# Patient Record
Sex: Female | Born: 1955 | Race: White | Hispanic: No | Marital: Married
Health system: Midwestern US, Academic
[De-identification: ages and names within clinical notes are randomized; demographics above are authoritative.]

---

## 2018-11-15 ENCOUNTER — Encounter: Admit: 2018-11-15 | Discharge: 2018-11-15 | Payer: MEDICARE

## 2018-11-15 ENCOUNTER — Ambulatory Visit: Admit: 2018-11-15 | Discharge: 2018-11-15 | Payer: MEDICARE

## 2018-11-15 DIAGNOSIS — L409 Psoriasis, unspecified: Secondary | ICD-10-CM

## 2018-11-15 DIAGNOSIS — D229 Melanocytic nevi, unspecified: Secondary | ICD-10-CM

## 2018-11-15 MED ORDER — CALCIPOTRIENE 0.005 % TP OINT
TOPICAL | 6 refills | Status: DC
Start: 2018-11-15 — End: 2019-03-09

## 2018-11-15 MED ORDER — CLOBETASOL 0.05 % TP OINT
OPHTHALMIC | 1 refills | 30.00000 days | Status: DC
Start: 2018-11-15 — End: 2019-03-09

## 2018-11-15 NOTE — Progress Notes
ATTESTATION    I personally performed the key portions of the E/M visit, discussed case with resident and concur with resident documentation of history, physical exam, assessment, and treatment plan unless otherwise noted.    Staff name:  Tekela Garguilo M Onix Jumper, MD Date:  11/15/2018

## 2018-11-15 NOTE — Patient Instructions
-   avoid trauma, rubbing or scratching as this can exacerbate psoriasis  - follow up regularly with PCP to manage associated co-morbidities (Cardiovascular disease, metabolic syndrome, HTN, dyslipidemia, DM, hepatic disease, lymphoma, mood disorders)  - 30% of patients with psoriasis can develop psoriatic arthritis

## 2018-11-15 NOTE — Progress Notes
Date of Service: 11/15/2018    Subjective:             Meghan Nash is a 63 y.o. female.    History of Present Illness  New pt.    # Rash on L medial palm, R thumb, and R interdigital area between 1st and 2nd toes   - present since 2014  - past treatments:   - topical steroids (betamethasone ointment)   - many other creams   - has been told this is psoriasis  - has nephew with psoriasis  - no joint pain or swelling  - no h/o malignancy, TB, autoimmune disease, IBD, MS  - pt wants to try something other than topicals as she has tried these for years and does not get much benefit     # Brown spots on the head, trunk, arms and legs.   - H/o blistering sunburns. No tanning bed usage.    Personal Hx: No history of skin cancer.   Family Hx: Brother with melanoma.   Social Hx: quit smoking 2018       Review of Systems   Constitutional: Negative for appetite change, diaphoresis, fatigue, fever and unexpected weight change.   HENT: Negative for congestion, mouth sores and sore throat.    Eyes: Negative for pain, redness, itching and visual disturbance.   Respiratory: Negative for cough and shortness of breath.    Cardiovascular: Negative for palpitations and leg swelling.   Gastrointestinal: Negative for abdominal pain, blood in stool, diarrhea, nausea and vomiting.   Genitourinary: Negative for difficulty urinating and hematuria.   Musculoskeletal: Negative for arthralgias and myalgias.   Neurological: Negative for dizziness and seizures.   Hematological: Does not bruise/bleed easily.   Psychiatric/Behavioral: Negative for confusion and dysphoric mood. The patient is not nervous/anxious.          Objective:         No current outpatient medications on file.     Vitals:    11/15/18 1318   Resp: 16   Weight: 90.7 kg (200 lb)   Height: 170.2 cm (67)   PainSc: Zero     Body mass index is 31.32 kg/m?Marland Kitchen     Physical Exam        Areas Examined (all normal unless noted below):  General: Alert and Oriented x 3, Well-nourished Eyes: Normal Conjunctivae, EOMI  Psych: normal mood  Head/Face  Neck  Chest/breasts/axillae  Back  Abdomen  Buttocks/groin/genitalia   R upper ext  L upper ext  R lower ext  L lower ext    Pertinent findings include:  Multiple brown and tan evenly pigmented macules are distributed over the examined areas.  All have symmetric similar dermascopic findings with primarily globular and reticular patterns.    Red scaly plaque on L medial palm, R thumb    Red scaly plaque on R 1st and 2nd interdigital toe    Assessment and Plan:  # Melanocytic nevi  - Reassured examined lesions appear normal  - counseled lesions that change in size, shape, color or are tender warrant further evaluation  - RTC for new/changing lesions    # Psoriasis w/ out arthritis:  - failed topical steroids in the past  - avoid trauma, rubbing or scratching as this can exacerbate psoriasis  - follow up regularly with PCP to manage associated co-morbidities (Cardiovascular disease, metabolic syndrome, HTN, dyslipidemia, DM, hepatic disease, lymphoma, mood disorders)  - 30% of patients with psoriasis can develop psoriatic arthritis  -  switch to Rx clobetasol 0.05% ointment BID TAA on hands and feet, alternating every other day with below  - start Rx dovonex cream apply BID TAA on hands and feet every other day   - KOH scraping negative on feet   - plan to start Rx otezla (discussed benefits and side effects including GI upset, headache)      RTC 3 months

## 2018-11-17 ENCOUNTER — Encounter: Admit: 2018-11-17 | Discharge: 2018-11-17 | Payer: MEDICARE

## 2018-11-17 MED ORDER — APREMILAST 10 MG (4)-20 MG (4)-30 MG (47) PO DSPK
ORAL_TABLET | ORAL | 0 refills | 30.00000 days | Status: DC
Start: 2018-11-17 — End: 2019-03-09

## 2018-11-17 NOTE — Progress Notes
Prior-authorization for Calcipotriene 0.005% ointment 60g per 30 days was approved starting 11/17/2018 and is valid until 02/02/2020. Pharmacy has been notified of approval.   Celene Kras, LPN

## 2018-11-18 ENCOUNTER — Encounter: Admit: 2018-11-18 | Discharge: 2018-11-18 | Payer: MEDICARE

## 2018-11-18 MED ORDER — OTEZLA 30 MG PO TAB
30 mg | ORAL_TABLET | Freq: Two times a day (BID) | ORAL | 3 refills | 30.00000 days | Status: DC
Start: 2018-11-18 — End: 2019-03-09

## 2018-11-21 ENCOUNTER — Encounter: Admit: 2018-11-21 | Discharge: 2018-11-21 | Payer: MEDICARE

## 2018-11-21 NOTE — Progress Notes
The specialty pharmacy is currently waiting on the following information for [place] specialty medication, Rutherford Nail:    Patient's signature and Patient's financial information    The specialty pharmacy team has requested this information and until it is received the specialty pharmacy will not be able to continue the process for specialty medication access.    [place] Patient Advocate  [phone]

## 2018-11-21 NOTE — Progress Notes
The Prior Authorization for Rutherford Nail was approved for Mackynzie Lewerenz from 11/17/18 to 02/02/19.  The copay is $905.35.  Delylah Buice has stated this copay is not affordable.  Pharmacy will work on obtaining medication assistance.  Pharmacy will continue to follow.  If no assistance is available, the specialty pharmacy will contact the provider's clinic contact.    [place] Patient Advocate  [phone]

## 2018-12-05 ENCOUNTER — Encounter: Admit: 2018-12-05 | Discharge: 2018-12-05 | Payer: MEDICARE

## 2018-12-20 ENCOUNTER — Encounter: Admit: 2018-12-20 | Discharge: 2018-12-20 | Payer: MEDICARE

## 2019-03-09 ENCOUNTER — Encounter: Admit: 2019-03-09 | Discharge: 2019-03-09 | Payer: MEDICARE

## 2019-03-09 ENCOUNTER — Ambulatory Visit: Admit: 2019-03-09 | Discharge: 2019-03-09 | Payer: MEDICARE

## 2019-03-09 DIAGNOSIS — L239 Allergic contact dermatitis, unspecified cause: Secondary | ICD-10-CM

## 2019-03-09 DIAGNOSIS — K130 Diseases of lips: Secondary | ICD-10-CM

## 2019-03-09 DIAGNOSIS — L409 Psoriasis, unspecified: Secondary | ICD-10-CM

## 2019-03-09 MED ORDER — DESONIDE 0.05 % TP CREA
Freq: Two times a day (BID) | TOPICAL | 3 refills | Status: AC
Start: 2019-03-09 — End: ?

## 2019-03-09 MED ORDER — CLOBETASOL 0.05 % TP OINT
OPHTHALMIC | 1 refills | 30.00000 days | Status: DC
Start: 2019-03-09 — End: 2019-07-05

## 2019-03-09 MED ORDER — CALCIPOTRIENE 0.005 % TP OINT
TOPICAL | 6 refills | Status: AC
Start: 2019-03-09 — End: ?

## 2019-03-09 MED ORDER — APREMILAST 10 MG (4)-20 MG (4)-30 MG (47) PO DSPK
ORAL_TABLET | ORAL | 0 refills | 30.00000 days | Status: DC
Start: 2019-03-09 — End: 2019-05-23
  Filled 2019-05-03: qty 55, 28d supply, fill #1

## 2019-03-09 NOTE — Progress Notes
Date of Service: 03/09/2019    Subjective:             Meghan Nash is a 64 y.o. female.    History of Present Illness    Return patient, last seen 11/15/18    # Psoriasis w/o out arthritis   - Diagnosed at last visit, ordered [facility] but has not been approved yet  - Currently using clobetasol and dovonex  - present since 2014  - past treatments:               - topical steroids (betamethasone ointment)               - many other creams   - has nephew with psoriasis  - no joint pain or swelling  - no h/o malignancy, TB, autoimmune disease, IBD, MS  - pt wants to try something other than topicals as she has tried these for years and does not get much benefit   ?  # Brown spots on the head, trunk, arms and legs.?  - H/o blistering sunburns. No tanning bed usage.  ?  Personal Hx: No history of skin cancer.   Family Hx: Brother with melanoma.   Social Hx: quit smoking 2018     Review of Systems   Constitutional: Negative for appetite change and unexpected weight change.   Gastrointestinal: Negative for diarrhea, nausea and vomiting.         Objective:         ? apremilast (OTEZLA) 10 mg (4)-20 mg (4)-30 mg (47) tablet starter pack Take by mouth as directed on package.   ? apremilast (OTEZLA) 30 mg tablet Take one tablet by mouth twice daily.   ? calcipotriene (DOVONOX) 0.005 % topical ointment Apply to affected area on hands and feet every other day   ? cholecalciferol (VITAMIN D-3) 1,000 units tablet Take 1,000 Units by mouth daily.   ? clobetasoL (TEMOVATE) 0.05 % topical ointment Apply to affected areas on hands and feet every other day. Do not use on face, groin, underarms   ? escitalopram oxalate (LEXAPRO) 20 mg tablet Take 20 mg by mouth daily.   ? levothyroxine (SYNTHROID) 50 mcg tablet Take 50 mcg by mouth daily 30 minutes before breakfast.   ? losartan (COZAAR) 25 mg tablet Take 25 mg by mouth daily.   ? metFORMIN (GLUCOPHAGE) 1,000 mg tablet Take 1,000 mg by mouth twice daily with meals. ? omeprazole DR (PRILOSEC) 20 mg capsule Take 20 mg by mouth daily before breakfast.   ? pravastatin (PRAVACHOL) 40 mg tablet Take 40 mg by mouth at bedtime daily.   ? semaglutide (OZEMPIC) 0.25 mg or 0.5 mg(2 mg/1.5 mL) injection PEN Inject  under the skin every 7 days.   ? triamterene-hydrochlorothiazide (DYAZIDE) 37.5-25 mg capsule Take 1 capsule by mouth every morning.     Vitals:    03/09/19 1352   Weight: 90.7 kg (200 lb)   Height: 170.2 cm (67)   PainSc: Zero     Body mass index is 31.32 kg/m?Marland Kitchen     Physical Exam    Areas Examined (all normal unless noted below):  General: Alert and Oriented x 3, Well-nourished  Eyes: Normal Conjunctivae, EOMI  Psych: normal mood  Head/Face  Neck  Chest/breasts/axillae  Back  Abdomen  Buttocks/groin/genitalia   R upper ext  L upper ext  R lower ext  L lower ext  ?  Pertinent findings include:  Multiple brown and tan evenly pigmented macules are  distributed over the examined areas.? All have symmetric similar dermascopic findings with primarily globular and reticular patterns.  Pink scaly areas on the vermillion lips  Red scaly plaque on L medial palm, R thumb  Red scaly plaque on R 1st and 2nd interdigital toe     Assessment and Plan:    Cheilitis - new and flaring  - Favor irritant contact vs allergic contact dermatitis.   - Stop all products that you are currently using and switch to plain vaseline for moisturizer.   - Start desonide to the AA BID until improved, then use PRN.   - If no improvement, RTC for re-eval.     Psoriasis w/ out arthritis   - Currently mildly improved with topicals; plan was to do [place] but patient lost FA paperwork.   - Continue Rx clobetasol 0.05% ointment BID TAA on hands and feet, alternating every other day with below. Refilled today.  - Continue Rx dovonex cream apply BID TAA on hands and feet every other day. Refilled today.  - Does not live close, not a candidate for phototherapy - Plan to start Rx otezla (discussed benefits and side effects including GI upset, headache) as previously discussed if able to get it covered.  - Patient expressed understanding, all questions answered.   ?  Melanocytic nevi  - Reassured examined lesions appear normal  - counseled lesions that change in size, shape, color or are tender warrant further evaluation  - RTC for new/changing lesions    RTC 4 months    Charm Barges, MD  Department of Dermatology    In the presence of Gardner Candle, MD,  I have taken down these notes, Ferdinand Cava, Scribe. 03/09/2019 1:54 PM

## 2019-03-09 NOTE — Progress Notes
Submitted prior-authorization request and order for Otezla to [facility] [place] pharmacy, outcome pending.

## 2019-03-10 ENCOUNTER — Encounter: Admit: 2019-03-10 | Discharge: 2019-03-10 | Payer: MEDICARE

## 2019-03-10 NOTE — Progress Notes
Called Kristn Appelman to discuss medication assistance regarding their medication: Otezla.  Left voicemail asking patient to return call to the specialty pharmacy.  Will call patient again in 2 business days if no call back received.Thomes Lolling, [place] Patient Advocate, Specialty Pharmacy  [phone]

## 2019-03-14 ENCOUNTER — Encounter: Admit: 2019-03-14 | Discharge: 2019-03-14 | Payer: MEDICARE

## 2019-03-15 ENCOUNTER — Encounter: Admit: 2019-03-15 | Discharge: 2019-03-15 | Payer: MEDICARE

## 2019-04-17 ENCOUNTER — Encounter: Admit: 2019-04-17 | Discharge: 2019-04-17 | Payer: MEDICARE

## 2019-05-03 ENCOUNTER — Encounter: Admit: 2019-05-03 | Discharge: 2019-05-03 | Payer: MEDICARE

## 2019-05-03 NOTE — Progress Notes
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    The dosing regimen according to the dose titration schedule below is planned to continue indefinitely which is appropriate for [name]. No renal or hepatic adjustments are required.    The following dose titration will be used: 10mg  by mouth in the morning on day 1. Then 10mg  by mouth twice daily on day 2. Then 10 mg by mouth in the morning and 20mg  in the evening on day 3. Then 20mg  by mouth twice daily on day 4. Then 20 mg by mouth in the morning and 30mg  in the evening on day 5. Then 30mg  by mouth twice daily on day 6 and thereafter.     The patient has the ability to self-administer the medication.     Therapeutic Goals: Remission of psoriasis, skin healing, prevent worsening/spread of skin plaques and inflammation, reduce symptoms of itching, burning, and pain; avoid/mitigate side effects of Otezla    Baseline Characteristics    Previously trialed agents for this indication: betamethasone, clobetasol, calcipotriene   Medications currently being used for this indication: betamethasone, clobetasol,   calcipotriene   Allergy and/or intolerance to previous medications: penicillins   Criteria for therapy/risk factors:Currently mildly improved with topicals   Pattern/location of skin changes: Multiple brown and tan evenly pigmented macules  are distributed over the examined areas.?All have symmetric similar dermascopic  findings with primarily globular and reticular patterns.Pink scaly areas on the  vermillion lips. Red scaly plaque on L medial palm, R thumb. Red scaly plaque on R  1st and  2nd interdigital toe   Involved joints: none   Mental health history: The patient does not have a history of mental health problems.     Labs  Wt Readings from Last 1 Encounters:   03/09/19 90.7 kg (200 lb)     CrCl cannot be calculated (No successful lab value found.).     Allergies  Allergies   Allergen Reactions   ? Pcn [Penicillins] RASH       Past Medical History  No past medical history on file.    Vaccination Status Assessment     There is no immunization history on file for this patient.  Vaccine history was reviewed with the patient. The patient was reminded about the importance of receiving an annual influenza vaccine as indicated.     Pregnancy Status  Pregnancy status was assessed and determined to be: Female, not of child-bearing potential: education not applicable.     Medication Reconciliation  Medication reconciliation is based on the patient?s most recent med list in electronic medical record (EMR) including herbal products and OTC medications. The patients? medication list will be updated during patient education, after speaking with the patient and prior to dispensing the medication.     Home Medications    Medication Sig   apremilast (OTEZLA) 10 mg (4)-20 mg (4)-30 mg (47) tablet starter pack Take by mouth as directed on package.   calcipotriene (DOVONOX) 0.005 % topical ointment Apply to affected area on hands and feet every other day   cholecalciferol (VITAMIN D-3) 1,000 units tablet Take 1,000 Units by mouth daily.   clobetasoL (TEMOVATE) 0.05 % topical ointment Apply to affected areas on hands and feet every other day. Do not  use on face, groin, underarms   desonide (TRIDESILON) 0.05 % topical cream Apply  topically to affected area twice daily.   escitalopram oxalate (LEXAPRO) 20 mg tablet Take 20 mg by mouth daily.   levothyroxine (SYNTHROID) 50 mcg tablet Take 50 mcg by mouth daily 30 minutes before breakfast.   losartan (COZAAR) 25 mg tablet Take 25 mg by mouth daily.   metFORMIN (GLUCOPHAGE) 1,000 mg tablet Take 1,000 mg by mouth twice daily with meals.   omeprazole DR (PRILOSEC) 20 mg capsule Take 20 mg by mouth daily before breakfast.   pravastatin (PRAVACHOL) 40 mg tablet Take 40 mg by mouth at bedtime daily.   semaglutide (OZEMPIC) 0.25 mg or 0.5 mg(2 mg/1.5 mL) injection PEN Inject  under the skin every 7 days.   triamterene-hydrochlorothiazide (DYAZIDE) 37.5-25 mg capsule Take 1 capsule by mouth every morning.       Drug-Drug Interactions  No new significant drug-drug or drug-food interactions were identified.    Drug-Food Interactions  There are no significant drug-food interactions. This medication can be taken with or without food.     Contraindications  Contraindications to the use of Otezla (apremilast)  include a serious hypersensitivity to Otezla (apremilast) or any component of the formulation.    Safety Precautions  Neuropsychiatric effects: Neuropsychiatric effects (eg, depression, suicidal ideation, mood changes) have been reported. Use with caution in patients with a history of depression and/or suicidal thoughts/behavior. Instruct patients/caregivers to report worsening psychiatric symptoms and consider risks/benefits of continuation of therapy in such patients.     Weight loss: May cause weight loss; monitor weight regularly. Discontinuation of therapy should be considered with unexplained or significant weight loss.     Risk Evaluation and Mitigation Strategy (REMS) Assessment  No REMS is required for this medication.     Meghan Nash was contacted via telephone to provide medication education on their new specialty medication.    Meghan Nash was educated on the medication name Meghan Nash (apremilast) along with the medication class PDE-4. The indication for treatment, dose, route, frequency, and duration were discussed with the patient. The patient was educated on timely administration of therapy and management of missed doses. Adherence with therapy was discussed with the patient. The patient's ability to be adherent with drug therapies was discussed and the patient was provided options for tools/resources that promote adherence to therapy.     The patient's ability to self-administer the medication was assessed.  The patient was educated on proper administration/injection technique for their therapy and verbalized acceptance and understanding. Appropriate safe-handling, storage, and disposal directions were reviewed with the patient.     Contraindications to therapy, safety precautions, and common side effects were discussed with the patient. They were instructed to seek medical attention immediately if they experience signs of an allergic reaction, including but not limited to: a rash; hives; itching; red, swollen, blistered, or peeling skin with or without fever.     Requirements of the REMS program were discussed with the patient as applicable.     A medication history and reconciliation was performed (including prescription medications, supplements, over the counter, and herbal products).    Drug-drug and drug-food interactions with the new therapy were assessed and reviewed with the patient. The patient was instructed to speak with their health care provider before starting any new drug, including prescription or over the counter, natural products, or vitamins.    Pregnancy potential was reviewed with the patient. Female, not of child-bearing potential: education not applicable.  Appropriate recommended vaccinations were reviewed and discussed with the patient.    The monitoring and follow-up plan was discussed with the patient. The patient was instructed to contact their health care provider if their symptoms or health problems do not get better or if they become worse. Meghan Nash was instructed to contact the specialty pharmacy at [phone] if they have any questions or concerns regarding their medication therapy.     Based on the patients' preference the medication(s) will be picked up or shipped from [facility].     Meghan Nash was given the opportunity to ask questions and did not have any questions at this time.     Follow-up Plan   An initial medication assessment and education has been completed.     Meghan Nash, PHARMD

## 2019-05-08 ENCOUNTER — Encounter: Admit: 2019-05-08 | Discharge: 2019-05-08 | Payer: MEDICARE

## 2019-05-08 NOTE — Progress Notes
Copay assistance was obtained for the specialty medication Rutherford Nail using grant from [employer] and now the copay is $0.  Meghan Nash has stated this copay is affordable.  The specialty pharmacy will pursue additional copay assistance as necessary.  The specialty pharmacy will reach out to the ambulatory clinical pharmacist if the copay becomes unaffordable.    The medication will be delivered to patient's prescription address per the patient's request.    [place] Patient Advocate  [phone]

## 2019-05-23 MED ORDER — OTEZLA 30 MG PO TAB
30 mg | ORAL_TABLET | Freq: Two times a day (BID) | ORAL | 3 refills | 30.00000 days | Status: AC
Start: 2019-05-23 — End: ?
  Filled 2019-05-25: qty 60, 30d supply, fill #1

## 2019-05-23 MED ORDER — OTEZLA STARTER 10 MG (4)-20 MG (4)-30 MG (47) PO DSPK
ORAL_TABLET | 0 refills | Status: CN
Start: 2019-05-23 — End: ?

## 2019-06-06 ENCOUNTER — Encounter: Admit: 2019-06-06 | Discharge: 2019-06-06 | Payer: MEDICARE

## 2019-06-06 NOTE — Progress Notes
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    The patient has completed their initial dose titration and will continue their regimen of 30 mg by mouth twice daily is planned to continue indefinitely which is appropriate for Meghan Nash. No renal or hepatic adjustments are required.     Response to Therapy  Patient assessments:  Subjective clinical assessment: on a scale of 1 to 10, the patient rates they are feeling 8 out of 10 while on treatment.  Subjective Quality of Life Measurement: 10 was able to complete all normal daily activities all days over the past 30 days.    Therapeutic Goals:  Concurrent medications being used for this indication: betamethasone, clobetasol, calcipotriene  Steroid use in the past 30 days: None  Body surface area involvement: Area around her mouth has cleared up 80% and her hands have improved 40%    As the patient is achieving therapeutic benefit from the therapy the plan is to continue.     Adverse Effects  Meghan Nash is having the following adverse effect(s) of diarrhea but cleared up after completing the starter pack. The adverse effect(s) were not severe enough to interfere with adherence    Adherence  Refill and adherence history were reviewed with the patient. The patient is adherent with refills and is meeting their refill goal. They report no missed doses over the past 30 days.  The patient is meeting their adherence goal. The patient was reminded about the refill process and re-educated on the importance of adherence.    Allergies   Allergies   Allergen Reactions   ? Pcn [Penicillins] RASH        Vaccination Status Assessment     There is no immunization history on file for this patient.  Vaccine history was reviewed with the patient. The patient was reminded about the importance of receiving an annual influenza vaccine as indicated.     Medication Reconciliation  A medication history and reconciliation were performed (including prescription medications, supplements, over the counter, and herbal products). The medication list was updated and the patients? current medication list is included below.     Home Medications    Medication Sig   apremilast (OTEZLA) 30 mg tablet Take one tablet by mouth twice daily.   calcipotriene (DOVONOX) 0.005 % topical ointment Apply to affected area on hands and feet every other day   cholecalciferol (VITAMIN D-3) 1,000 units tablet Take 1,000 Units by mouth daily.   clobetasoL (TEMOVATE) 0.05 % topical ointment Apply to affected areas on hands and feet every other day. Do not use on face, groin, underarms   desonide (TRIDESILON) 0.05 % topical cream Apply  topically to affected area twice daily.   escitalopram oxalate (LEXAPRO) 20 mg tablet Take 20 mg by mouth daily.   levothyroxine (SYNTHROID) 50 mcg tablet Take 50 mcg by mouth daily 30 minutes before breakfast.   losartan (COZAAR) 25 mg tablet Take 25 mg by mouth daily.   metFORMIN (GLUCOPHAGE) 1,000 mg tablet Take 1,000 mg by mouth twice daily with meals.   omeprazole DR (PRILOSEC) 20 mg capsule Take 20 mg by mouth daily before breakfast.   pravastatin (PRAVACHOL) 40 mg tablet Take 40 mg by mouth at bedtime daily.   semaglutide (OZEMPIC) 0.25 mg or 0.5 mg(2 mg/1.5  mL) injection PEN Inject  under the skin every 7 days.   triamterene-hydrochlorothiazide (DYAZIDE) 37.5-25 mg capsule Take 1 capsule by mouth every morning.        Drug-drug and drug-food interactions between the patients? specialty medication and their medication list were assessed and reviewed with the patient. The patient was instructed to speak with their health care provider before starting any new drugs, including prescription or over the counter, natural / herbal products, or vitamins.    No new significant drug-drug or drug-food interactions were identified.    Their regimen can be taken with or without food.    Pregnancy Status  Pregnancy status was assessed and determined to be: Female, not of child-bearing potential: education not applicable.     Risk Evaluation and Mitigation Strategy (REMS) Assessment  No REMS is required for this medication.     Follow-up Plan   Meghan Nash was given the opportunity to ask questions and did not have any questions at this time. The patient was encouraged to call with questions. The patient will be contacted to complete another reassessment within 1 year.    Meghan Nash, PHARMD

## 2019-07-05 ENCOUNTER — Encounter: Admit: 2019-07-05 | Discharge: 2019-07-05 | Payer: MEDICARE

## 2019-07-05 ENCOUNTER — Ambulatory Visit: Admit: 2019-07-05 | Discharge: 2019-07-06 | Payer: MEDICARE

## 2019-07-05 DIAGNOSIS — L821 Other seborrheic keratosis: Secondary | ICD-10-CM

## 2019-07-05 DIAGNOSIS — D229 Melanocytic nevi, unspecified: Secondary | ICD-10-CM

## 2019-07-05 MED ORDER — CLOBETASOL 0.05 % TP OINT
OPHTHALMIC | 1 refills | 30.00000 days | Status: AC
Start: 2019-07-05 — End: ?

## 2019-07-05 MED FILL — OTEZLA 30 MG PO TAB: 30 mg | ORAL | 30 days supply | Qty: 60 | Fill #2 | Status: AC

## 2019-07-05 NOTE — Progress Notes
Date of Service: 07/05/2019    Subjective:             Meghan Nash is a 64 y.o. female.    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  # Brown spots on the head, trunk, arms and legs.?  - H/o blistering sunburns. No tanning bed usage.  ?    Personal Hx: No history of skin cancer.   Family Hx: Brother with melanoma.   Social Hx: quit smoking 2018        Review of Systems   Constitutional: Negative for appetite change and unexpected weight change.   Gastrointestinal: Negative for diarrhea, nausea and vomiting.         Objective:         ? apremilast (OTEZLA) 30 mg tablet Take one tablet by mouth twice daily.   ? calcipotriene (DOVONOX) 0.005 % topical ointment Apply to affected area on hands and feet every other day   ? cholecalciferol (VITAMIN D-3) 1,000 units tablet Take 1,000 Units by mouth daily.   ? clobetasoL (TEMOVATE) 0.05 % topical ointment Apply to affected areas on hands and feet every other day. Do not use on face, groin, underarms   ? desonide (TRIDESILON) 0.05 % topical cream Apply  topically to affected area twice daily.   ? escitalopram oxalate (LEXAPRO) 20 mg tablet Take 20 mg by mouth daily.   ? levothyroxine (SYNTHROID) 50 mcg tablet Take 50 mcg by mouth daily 30 minutes before breakfast.   ? losartan (COZAAR) 25 mg tablet Take 25 mg by mouth daily.   ? metFORMIN (GLUCOPHAGE) 1,000 mg tablet Take 1,000 mg by mouth twice daily with meals.   ? omeprazole DR (PRILOSEC) 20 mg capsule Take 20 mg by mouth daily before breakfast.   ? pravastatin (PRAVACHOL) 40 mg tablet Take 40 mg by mouth at bedtime daily.   ? semaglutide (OZEMPIC) 0.25 mg or 0.5 mg(2 mg/1.5 mL) injection PEN Inject  under the skin every 7 days.   ? triamterene-hydrochlorothiazide (DYAZIDE) 37.5-25 mg capsule Take 1 capsule by mouth every morning.     Vitals:    07/05/19 1511   Weight: 90.7 kg (200 lb)   Height: 170.2 cm (67)   PainSc: Zero     Body mass index is 31.32 kg/m?Marland Kitchen     Physical Exam  Areas Examined (all normal unless noted below):  General: Alert and Oriented x 3, Well-nourished  Eyes: Normal Conjunctivae, EOMI  Psych: normal mood  Head/Face  Neck  R upper ext  L upper ext  R lower ext  L lower ext  ?  Pertinent findings include:  Multiple brown and tan evenly pigmented macules are distributed over the examined areas.? All have symmetric similar dermascopic findings with primarily globular and reticular patterns.  Soft, pigmented, stuck-on-appearing papules are distributed over the bilateral  arms.  All have symmetric pebbled dermoscopic findings.  Red scaly plaque on L medial palm, R thumb  Red scaly plaque on R 1st and 2nd interdigital toe  *both above areas are thinner compared to prior visits     Assessment and Plan:    # Psoriasis w/ out arthritis   - Does not live close, not a candidate for phototherapy  - Continue Rx clobetasol 0.05% ointment BID TAA on hands and feet, alternating every other day with below. Refilled today.  - Continue Rx dovonex cream apply BID TAA on hands and feet every other day.   - Continue Rx Otezla 30mg  BID  - discussed the risks and benefits of Apremilast with patient including most commonly diarrhea, N/V and occasionally URI, depression, weight loss.   - Stressed importance of closely monitoring for any mood changes, worsening of depression  - patient verbalized understanding. All Qs answered  ?  # Melanocytic nevi  - Reassured examined lesions appear normal  - counseled lesions that change in size, shape, color or are tender warrant further evaluation  - RTC for new/changing lesions    # Seborrheic Keratoses  - reassurance, explained benign nature      RTC 3-4 months; will plan to do FBSE at next visit

## 2019-07-05 NOTE — Progress Notes
ATTESTATION    I personally performed the key portions of the E/M visit, discussed case with resident and concur with resident documentation of history, physical exam, assessment, and treatment plan unless otherwise noted.    Staff name:  Illene Labrador, MD Date:  07/05/2019

## 2019-07-06 DIAGNOSIS — L409 Psoriasis, unspecified: Principal | ICD-10-CM

## 2019-08-15 ENCOUNTER — Encounter: Admit: 2019-08-15 | Discharge: 2019-08-15 | Payer: MEDICARE

## 2019-08-16 MED FILL — OTEZLA 30 MG PO TAB: 30 mg | ORAL | 30 days supply | Qty: 60 | Fill #3 | Status: AC

## 2019-09-13 ENCOUNTER — Encounter: Admit: 2019-09-13 | Discharge: 2019-09-13 | Payer: MEDICARE

## 2019-09-18 ENCOUNTER — Encounter: Admit: 2019-09-18 | Discharge: 2019-09-18 | Payer: MEDICARE

## 2019-09-18 NOTE — Progress Notes
This is the third and final attempt to reach Meghan Nash to refill her specialty medication, Henderson [place], which was due for a refill on 09/15/2019.   Left voicemail asking the patient to call the specialty pharmacy at [phone].    At this time, the patient will be removed from the specialty pharmacy refill management program. The patient may be re-enrolled at any time by contacting the specialty pharmacy.    Fredda Hammed, CPhT  Pharmacy Patient Advocate, Specialty Pharmacy

## 2019-11-01 ENCOUNTER — Encounter: Admit: 2019-11-01 | Discharge: 2019-11-01 | Payer: MEDICARE

## 2019-11-01 ENCOUNTER — Ambulatory Visit: Admit: 2019-11-01 | Discharge: 2019-11-02 | Payer: MEDICARE

## 2019-11-01 DIAGNOSIS — D229 Melanocytic nevi, unspecified: Secondary | ICD-10-CM

## 2019-11-01 DIAGNOSIS — L821 Other seborrheic keratosis: Secondary | ICD-10-CM

## 2019-11-01 DIAGNOSIS — K130 Diseases of lips: Secondary | ICD-10-CM

## 2019-11-01 DIAGNOSIS — Z79899 Other long term (current) drug therapy: Secondary | ICD-10-CM

## 2019-11-01 DIAGNOSIS — L409 Psoriasis, unspecified: Principal | ICD-10-CM

## 2019-11-01 MED ORDER — CALCIPOTRIENE 0.005 % TP OINT
TOPICAL | 6 refills | Status: AC
Start: 2019-11-01 — End: ?

## 2019-11-01 MED ORDER — DESONIDE 0.05 % TP CREA
Freq: Two times a day (BID) | TOPICAL | 3 refills | Status: AC
Start: 2019-11-01 — End: ?

## 2019-11-01 MED ORDER — CLOBETASOL 0.05 % TP OINT
OPHTHALMIC | 1 refills | 30.00000 days | Status: AC
Start: 2019-11-01 — End: ?

## 2019-11-01 NOTE — Progress Notes
Date of Service: 11/01/2019    Subjective:             Meghan Nash is a 64 y.o. female.    History of Present Illness  Return patient, last seen 07/2019  ?  #?Psoriasis w/o out arthritis   ?  Prior hx  - present since 2014  - has nephew with psoriasis  - no joint pain or swelling  -?no h/o malignancy, TB, autoimmune disease, IBD, MS  ?  - Current treatments               - clobetasol and dovonex               - Otezla 30mg  BID (start after last visit)  Interval  - patient denies any worsening of depression since starting Otezla  - noted GI symptoms with Henderson [place], but this has improved  - Pt has not been using her topical steroids due to losing them while moving houses.   - She does not think Henderson [place] is working well.  ?  # Brown spots on the head, trunk, arms and legs.?  - H/o blistering sunburns.?No tanning bed usage.  ?  ?  Personal Hx:?No?history of skin cancer.   Family Hx:?Brother with melanoma.?  Social Hx:?quit smoking 2018     Review of Systems   Constitutional: Negative for appetite change and unexpected weight change.   Gastrointestinal: Negative for diarrhea, nausea and vomiting.         Objective:         ? apremilast (OTEZLA) 30 mg tablet Take one tablet by mouth twice daily.   ? calcipotriene (DOVONOX) 0.005 % topical ointment Apply to affected area on hands and feet every other day   ? cholecalciferol (VITAMIN D-3) 1,000 units tablet Take 1,000 Units by mouth daily.   ? clobetasoL (TEMOVATE) 0.05 % topical ointment Apply to affected areas on hands and feet every other day. Do not use on face, groin, underarms   ? desonide (TRIDESILON) 0.05 % topical cream Apply  topically to affected area twice daily.   ? escitalopram oxalate (LEXAPRO) 20 mg tablet Take 20 mg by mouth daily.   ? levothyroxine (SYNTHROID) 50 mcg tablet Take 50 mcg by mouth daily 30 minutes before breakfast.   ? losartan (COZAAR) 25 mg tablet Take 25 mg by mouth daily.   ? metFORMIN (GLUCOPHAGE) 1,000 mg tablet Take 1,000 mg by mouth twice daily with meals.   ? omeprazole DR (PRILOSEC) 20 mg capsule Take 20 mg by mouth daily before breakfast.   ? pravastatin (PRAVACHOL) 40 mg tablet Take 40 mg by mouth at bedtime daily.   ? semaglutide (OZEMPIC) 0.25 mg or 0.5 mg(2 mg/1.5 mL) injection PEN Inject  under the skin every 7 days.   ? triamterene-hydrochlorothiazide (DYAZIDE) 37.5-25 mg capsule Take 1 capsule by mouth every morning.     Vitals:    11/01/19 1512   Weight: 90.7 kg (200 lb)   Height: 170.2 cm (67)   PainSc: Zero     Body mass index is 31.32 kg/m?Marland Kitchen     Physical Exam  Areas Examined (all normal unless noted below):  General: Alert and Oriented x 3, Well-nourished  Eyes: Normal Conjunctivae, EOMI  Psych: normal mood  Head/Face  Neck  R upper ext  L upper ext  R lower ext  L lower ext  ?  Pertinent findings include:  Multiple brown and tan evenly pigmented macules are distributed over the examined areas.??All have  symmetric similar dermascopic findings with primarily globular and reticular patterns.  Soft, pigmented, stuck-on-appearing papules are distributed over the bilateral arms.  All have symmetric pebbled dermoscopic findings.  Red scaly plaque on L medial palm, R thumb  Red scaly plaque on R 1st and 2nd interdigital toe     Assessment and Plan:  ?  # Psoriasis w/?out?arthritis   - Does not live close, not a candidate for phototherapy  -?Continue?Rx clobetasol?0.05% ointment BID TAA on hands and feet, alternating every other day with below. Refilled today.  - Continue Rx dovonex cream apply BID TAA on hands and feet every other day.    - - Discussed the benefits, risks and side effects of IL-17 inhibitors in great details  - There is no personal history of inflammatory bowel disease  - Reviewed common adverse effects may include nasopharyngitis, URI, headache, herpes infection, injection site reaction, skin rash, nausea, and antibody development. Discussed this medicine may increase risk of serious infections including candida and tuberculosis, irritable bowel disease, and may cause anaphylaxis/hypersensitivity reaction. Instructed to hold the medication and seek medical attention if these occur   - Pt verbalized understanding and opted to start Rx ixekizumab (Taltz) 160 mg injection once followed by 80 mg at weeks 2, 4 ,6, 8, 10 and 12, and then 80 mg every 4 weeks  - Advised to avoid live vaccinations while on this medication  - Periodic lab monitoring will be required. Check CBC, CMP, QuantiFERON gold, HBVsAg, HBVsAb, HBVcAb, HCV antibody.  - Will need QuantiFERON gold yearly  - Advised patient to get her COVID-19 booster prior to starting Taltz   - Pharmacist messaged to begin approval process     # Melanocytic nevi  - Reassured examined lesions appear normal  - counseled lesions that change in size, shape, color or are tender warrant further evaluation  - RTC for new/changing lesions  ?  # Seborrheic Keratoses  - reassurance, explained benign nature  ?  RTC 3-4 months; will plan to do FBSE at next visit

## 2019-11-01 NOTE — Progress Notes
Provided education on ixekizumab Altamease Oiler), including:  -purpose, expected benefit  -dosing   -SC: 160mg  once; then 80mg  at weeks 2, 4, 6, 8, 10, 12; then 80 mg monthly thereafter  -side effects   -most common: injection-site reaction, N/D   -rare: IBD  -infection prevention   -staying up to date with inactivated vaccinations, avoid live vaccinations   -contact clinic during significant illness (fever or antibiotic use)   -inform clinic if any procedures are scheduled  -monitoring    -TB screening      Demonstrated Taltz devices for patient in clinic. She prefers prefilled syringe instead of autoinjector pen. Per Dr. Kathie Dike, will wait to send order until labs are resulted. Since patient cannot have TB test drawn today (after 2pm), she prefers all labs to be sent to the hospital in [place], [place]. Will ask Marcelino Duster, LPN to send orders.     Sharlee Blew, PHARMD

## 2019-11-02 ENCOUNTER — Encounter: Admit: 2019-11-02 | Discharge: 2019-11-02 | Payer: MEDICARE

## 2019-11-20 ENCOUNTER — Encounter: Admit: 2019-11-20 | Discharge: 2019-11-20 | Payer: MEDICARE

## 2019-11-20 NOTE — Progress Notes
Baseline labs needed prior to starting specialty medication, ixekizumab Altamease Oiler).     Labs needed: TB, Hep B antigen, Hep B core, Hep B antibody, CBC w/ Diff, Creatinine and hepatitis C antibody  Communication with Physician/APP: Physician/APP aware  Plan for obtaining labs: Patient plans to have labs drawn outside at [facility]       Called patient to check to see if she has completed baseline labs yet. Orders were sent to [facility] on 11/02/19. Patient was unaware orders were sent but called and confirmed they received them. Patient stated she will be getting labs this week. Instructed patient to have hospital fax results to [facility] dermatology clinic.     Sharlee Blew, PHARMD

## 2019-12-19 ENCOUNTER — Encounter: Admit: 2019-12-19 | Discharge: 2019-12-19 | Payer: MEDICARE

## 2019-12-25 ENCOUNTER — Encounter: Admit: 2019-12-25 | Discharge: 2019-12-25 | Payer: MEDICARE

## 2019-12-25 NOTE — Progress Notes
Contacted Meghan Nash to discuss renewing foundation assistance regarding their medication: R[place]ford Nail.  Left voicemail asking patient to return call to the specialty pharmacy [phone]).  Will call patient again in 5 business days if no call back received.Magdalen Spatz, CPhT  Pharmacy Patient Advocate, Specialty Pharmacy  [phone]

## 2020-01-03 ENCOUNTER — Encounter: Admit: 2020-01-03 | Discharge: 2020-01-03 | Payer: MEDICARE

## 2020-01-03 NOTE — Progress Notes
This is the third and final attempt to reach Meghan Nash to renew her foundation assistance her specialty medication, Rutherford Nail, which will expire on 02/02/2020.   Left voicemail asking the patient to call the specialty pharmacy at [phone].    Magdalen Spatz, CPhT  Pharmacy Patient Advocate, Specialty Pharmacy

## 2020-01-29 ENCOUNTER — Encounter: Admit: 2020-01-29 | Discharge: 2020-01-29 | Payer: MEDICARE

## 2020-02-27 ENCOUNTER — Encounter: Admit: 2020-02-27 | Discharge: 2020-02-27 | Payer: MEDICARE

## 2020-05-15 ENCOUNTER — Encounter: Admit: 2020-05-15 | Discharge: 2020-05-15 | Payer: MEDICARE

## 2020-05-15 NOTE — Progress Notes
Based on fill history and previous visit notes, patient is no longer taking the [place]. Thus, the patient will be removed from the specialty pharmacy patient management program. The medication has been removed from the patient's active medication list.  Should the patient's care team wish to restart the medication, a new prescription will need to be sent to [facility].       Also called patient to ask if she would like to proceed with Cosentyx as discussed in September (but never initiated). No answer. Left voicemail asking patient to return call to dermatology pharmacist at [phone]. If no return call from patient, will attempt again at a later date.     Henrietta Hoover, PHARMD

## 2021-03-19 ENCOUNTER — Encounter: Admit: 2021-03-19 | Discharge: 2021-03-19 | Payer: MEDICARE

## 2021-12-10 ENCOUNTER — Encounter: Admit: 2021-12-10 | Discharge: 2021-12-10 | Payer: MEDICARE

## 2021-12-15 ENCOUNTER — Encounter: Admit: 2021-12-15 | Discharge: 2021-12-15 | Payer: MEDICARE

## 2022-03-17 ENCOUNTER — Encounter: Admit: 2022-03-17 | Discharge: 2022-03-17 | Payer: MEDICARE

## 2023-09-25 IMAGING — MR MR knee LT wo con
5 of 7 series · 33 of 40 positions shown · non-contrast
Comparison: None.

Procedure(s): MR knee LT wo con

EXAMINATION: LEFT KNEE MAGNETIC RESONANCE IMAGING WITHOUT
CONTRAST.
HISTORY: Left knee pain.
TECHNIQUE: Multiplanar, multi-sequential MR images of the
left knee were
obtained without intravenous or intra-articular contrast.

[Series 5: PD fat-sat · axial · 4.0mm · 0.50mm/px · z∈[-83,+37]mm · 8 of 25 slices shown]
[im 1/25]
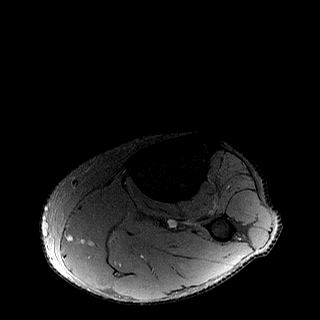
[im 4/25]
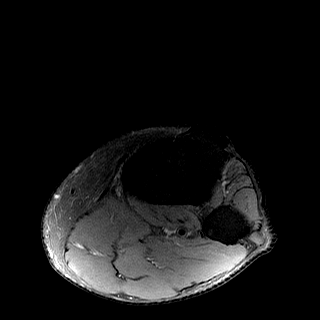
[im 7/25]
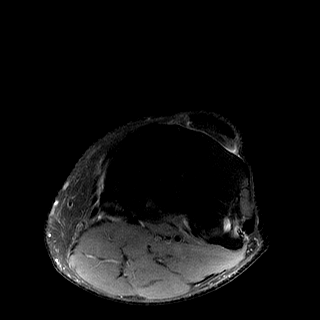
[im 11/25]
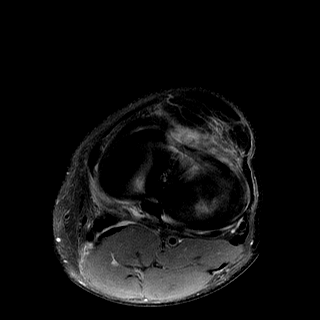
[im 14/25]
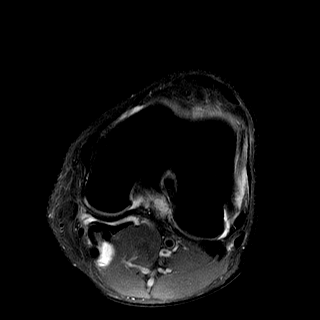
[im 18/25]
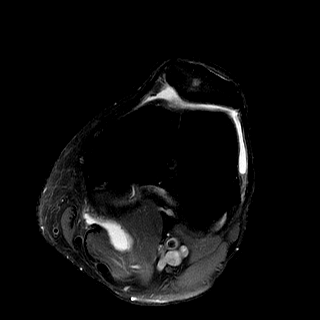
[im 21/25]
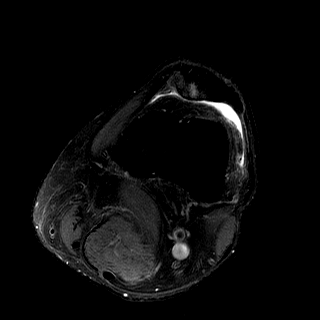
[im 25/25]
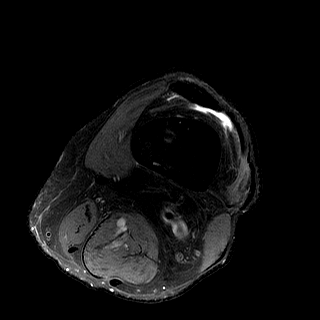

[Series 6: T1 · coronal · 4.0mm · 0.50mm/px · 8 of 25 slices shown]
[im 1/25]
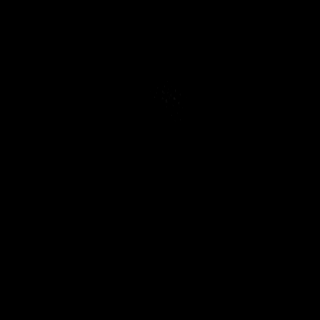
[im 4/25]
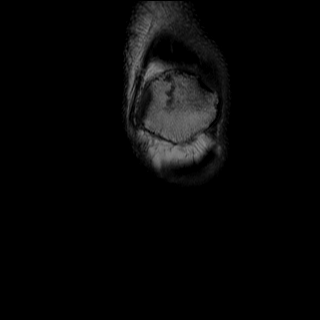
[im 7/25]
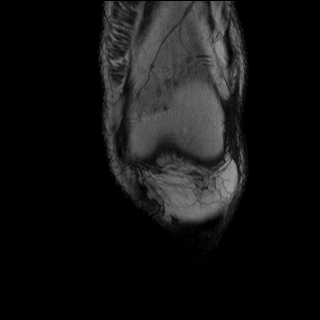
[im 11/25]
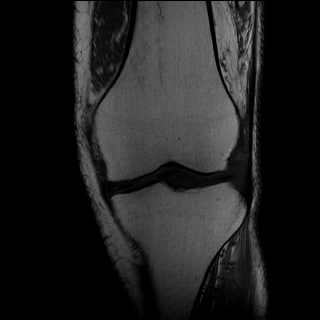
[im 14/25]
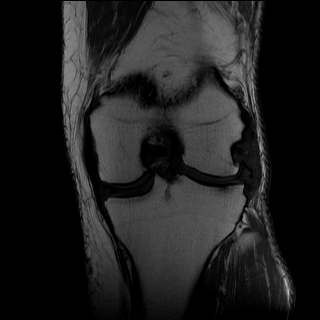
[im 18/25]
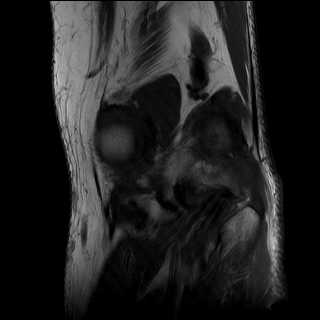
[im 21/25]
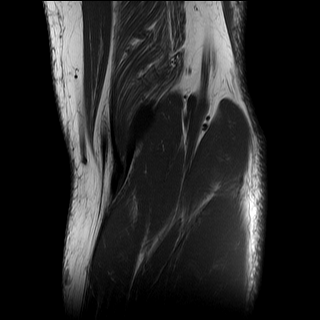
[im 25/25]
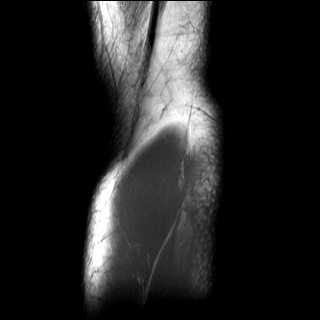

[Series 7: STIR · coronal · 4.0mm · 0.31mm/px · 5 of 25 slices shown]
[im 1/25]
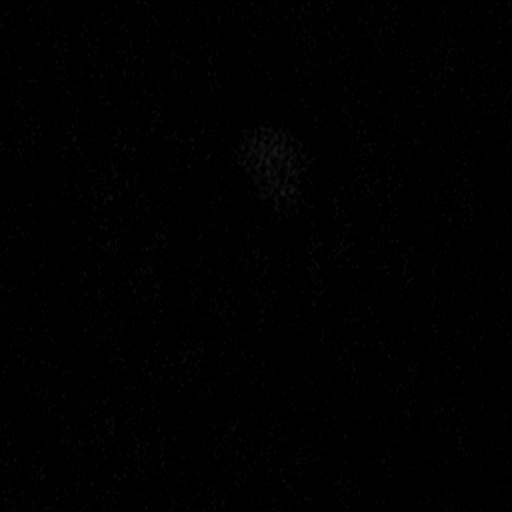
[im 4/25]
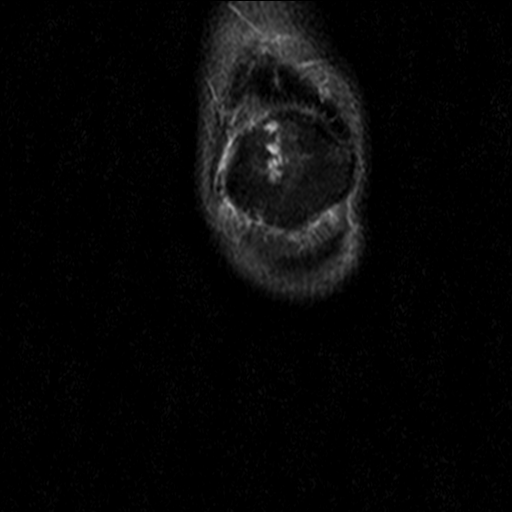
[im 7/25]
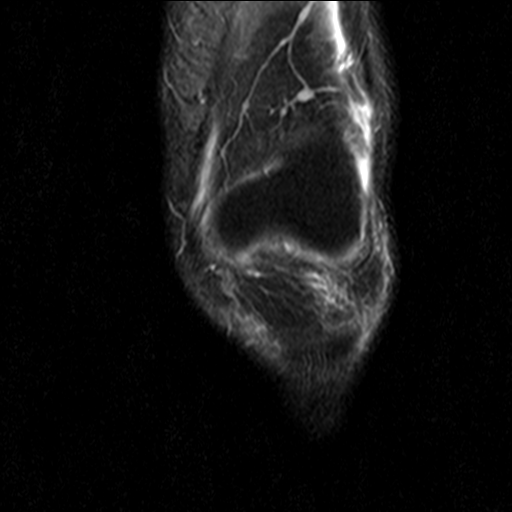
[im 11/25]
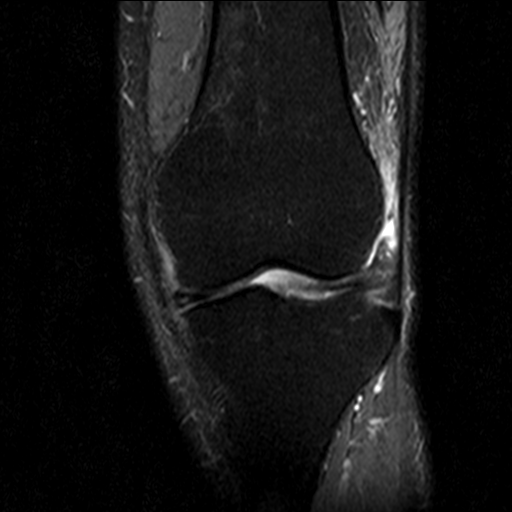
[im 14/25]
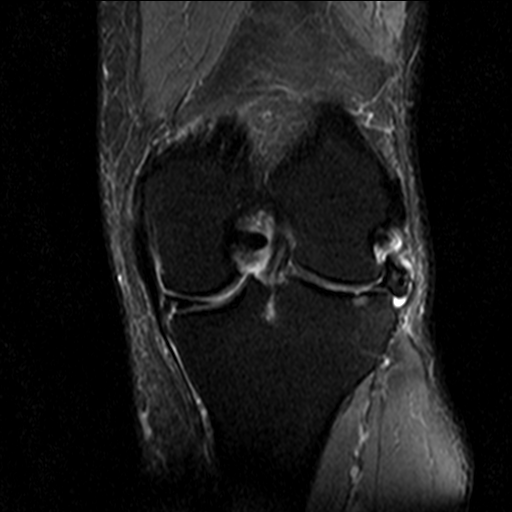

[Series 8: PD · sagittal · 4.0mm · 0.42mm/px · 6 of 21 slices shown]
[im 1/21]
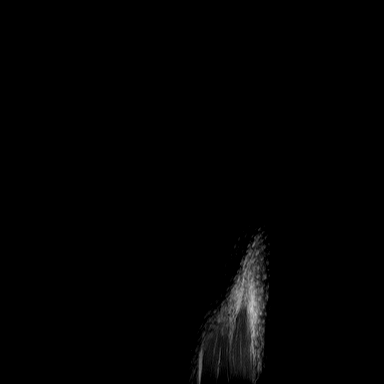
[im 5/21]
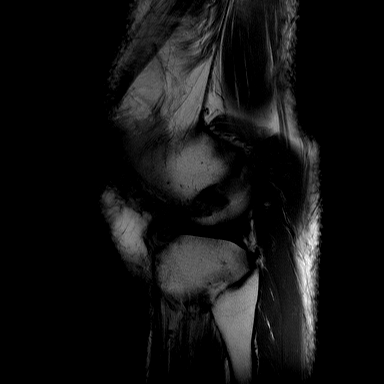
[im 9/21]
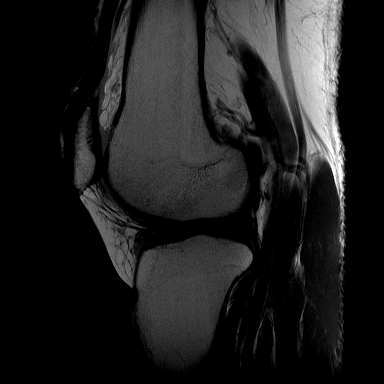
[im 13/21]
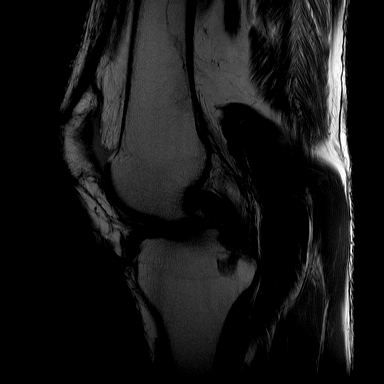
[im 17/21]
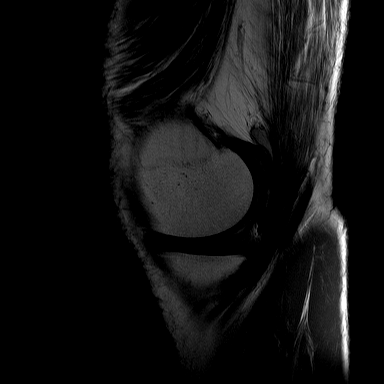
[im 21/21]
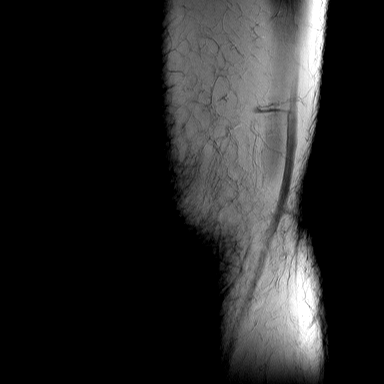

[Series 9: T2 fat-sat · sagittal · 4.0mm · 0.42mm/px · 6 of 21 slices shown]
[im 1/21]
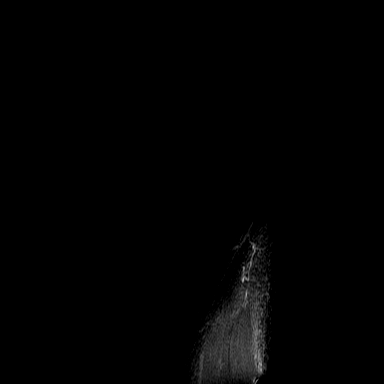
[im 5/21]
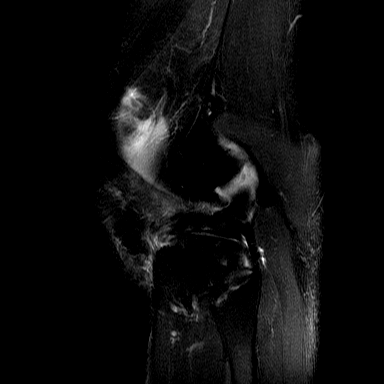
[im 9/21]
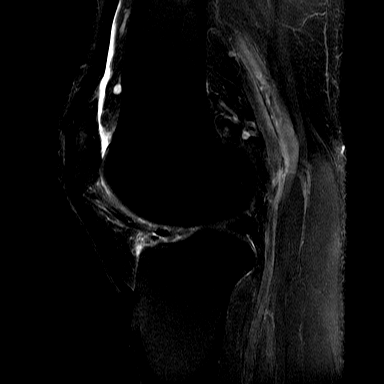
[im 13/21]
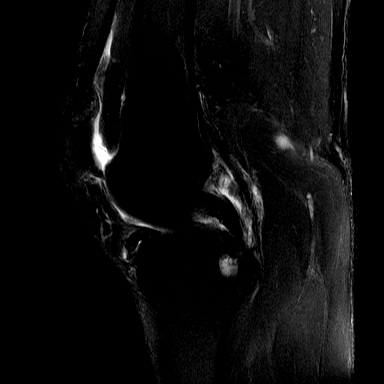
[im 17/21]
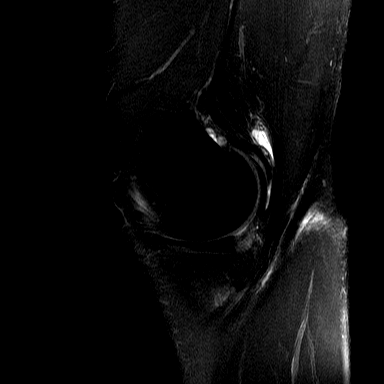
[im 21/21]
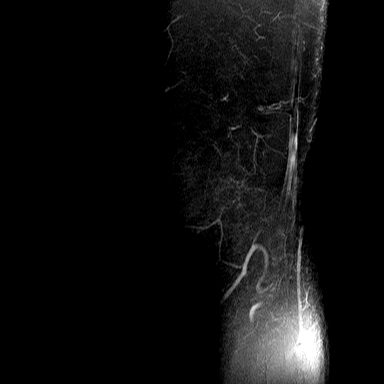

[33 of 40 positions shown; findings below may reference images not displayed]

FINDINGS: There is multidirectional tearing of the body and the
anterior horn of the
medial meniscus. There is severe attenuation and
multidirectional tearing of the
anterior horn of the lateral meniscus. There is myxoid
degeneration and
multidirectional tearing of the body of the lateral meniscus
with a flap of
meniscal tissue extending into the inferolateral gutter.
There is marked
peripheral extrusion of the body of the lateral meniscus
which is best seen on
the coronal images.

The anterior and posterior cruciate ligaments are intact.

The medial collateral ligamentous complex is intact. The
lateral supporting
structures are intact.

The distal quadriceps tendon and the patella tendon are
intact. The medial and
lateral patella retinacula are intact.

There is full-thickness cartilage loss of the median ridge
of the patella
superiorly with reactive subchondral marrow edema. The
trochlear cartilage is
normal. There is a congenitally shallow trochlear groove.
There is a small focus
of focal high-grade cartilage thinning of the central
weightbearing surfaces of
the lateral femorotibial compartment. The medial
femorotibial compartment
cartilage is well-preserved.

There is no knee effusion. There is a small popliteal cyst
without rupture. The
proximal tibiofibular joint is normal. No acute muscle
abnormality is seen.
IMPRESSION: 1. Multidirectional tearing of the body and the anterior
horn of the medial
meniscus. There is no displaced meniscal flap or
parameniscal cyst.

2. Severe attenuation and multidirectional tearing of the
anterior horn of the
lateral meniscus. There is also myxoid degeneration and
multidirectional tearing
of the body of the lateral meniscus with a flap of meniscal
tissue extending
into the inferolateral gutter. There is marked peripheral
extrusion of the body
of the lateral meniscus which is best seen on the coronal
images.

3. Full-thickness cartilage loss of the median ridge of the
patella superiorly
with reactive subchondral marrow edema and cystic change.

4. Small focus of high-grade cartilage thinning of the
central weightbearing
surfaces of the lateral femorotibial compartment.
